# Patient Record
Sex: Male | Born: 1990 | Race: Black or African American | Hispanic: No | Marital: Single | Smoking: Never smoker
Health system: Southern US, Community
[De-identification: ages and names within clinical notes are randomized; demographics above are authoritative.]

---

## 2014-02-18 ENCOUNTER — Emergency Department (HOSPITAL_COMMUNITY)
Admission: EM | Admit: 2014-02-18 | Discharge: 2014-02-18 | Disposition: A | Discharge status: Home / Self Care | Payer: No Typology Code available for payment source | Attending: Emergency Medicine | Admitting: Emergency Medicine

## 2014-02-18 ENCOUNTER — Encounter (HOSPITAL_COMMUNITY): Payer: Self-pay | Admitting: Emergency Medicine

## 2014-02-18 ENCOUNTER — Emergency Department (HOSPITAL_COMMUNITY): Payer: No Typology Code available for payment source

## 2014-02-18 DIAGNOSIS — R519 Headache, unspecified: Secondary | ICD-10-CM

## 2014-02-18 DIAGNOSIS — R002 Palpitations: Secondary | ICD-10-CM | POA: Insufficient documentation

## 2014-02-18 DIAGNOSIS — R079 Chest pain, unspecified: Secondary | ICD-10-CM | POA: Insufficient documentation

## 2014-02-18 DIAGNOSIS — R51 Headache: Secondary | ICD-10-CM | POA: Insufficient documentation

## 2014-02-18 LAB — I-STAT TROPONIN, ED: Troponin i, poc: 0 ng/mL (ref 0.00–0.08)

## 2014-02-18 LAB — CBC
HEMATOCRIT: 40.6 % (ref 39.0–52.0)
Hemoglobin: 13.1 g/dL (ref 13.0–17.0)
MCH: 24 pg — AB (ref 26.0–34.0)
MCHC: 32.3 g/dL (ref 30.0–36.0)
MCV: 74.5 fL — AB (ref 78.0–100.0)
Platelets: 266 10*3/uL (ref 150–400)
RBC: 5.45 MIL/uL (ref 4.22–5.81)
RDW: 13.6 % (ref 11.5–15.5)
WBC: 7 10*3/uL (ref 4.0–10.5)

## 2014-02-18 LAB — BASIC METABOLIC PANEL
BUN: 10 mg/dL (ref 6–23)
CALCIUM: 9.3 mg/dL (ref 8.4–10.5)
CO2: 26 meq/L (ref 19–32)
CREATININE: 0.87 mg/dL (ref 0.50–1.35)
Chloride: 101 mEq/L (ref 96–112)
GFR calc Af Amer: >90 mL/min (ref >90)
GFR calc non Af Amer: >90 mL/min (ref >90)
GLUCOSE: 92 mg/dL (ref 70–99)
Potassium: 4.1 mEq/L (ref 3.7–5.3)
Sodium: 139 mEq/L (ref 137–147)

## 2014-02-18 MED ORDER — DIPHENHYDRAMINE HCL 50 MG/ML IJ SOLN
25.0000 mg | Freq: Once | INTRAMUSCULAR | Status: AC
  Administered 2014-02-18: 25 mg via INTRAVENOUS
  Filled 2014-02-18: qty 1

## 2014-02-18 MED ORDER — SODIUM CHLORIDE 0.9 % IV BOLUS (SEPSIS)
1000.0000 mL | Freq: Once | INTRAVENOUS | Status: AC
  Administered 2014-02-18: 1000 mL via INTRAVENOUS

## 2014-02-18 MED ORDER — METOCLOPRAMIDE HCL 5 MG/ML IJ SOLN
10.0000 mg | Freq: Once | INTRAMUSCULAR | Status: AC
  Administered 2014-02-18: 10 mg via INTRAVENOUS
  Filled 2014-02-18: qty 2

## 2014-02-18 MED ORDER — KETOROLAC TROMETHAMINE 30 MG/ML IJ SOLN
30.0000 mg | Freq: Once | INTRAMUSCULAR | Status: AC
  Administered 2014-02-18: 30 mg via INTRAVENOUS
  Filled 2014-02-18: qty 1

## 2014-02-18 NOTE — ED Provider Notes (Signed)
History/physical exam/procedure(s) were performed by non-physician practitioner and as supervising physician I was immediately available for consultation/collaboration. I have reviewed all notes and am in agreement with care and plan.   Hilario Quarryanielle S Chisom Aust, MD 02/18/14 [phone]

## 2014-02-18 NOTE — ED Provider Notes (Signed)
CSN: [id number]     Arrival date & time 02/18/14  1214 History   First MD Initiated Contact with Patient 02/18/14 1233     Chief Complaint  Patient presents with  . Palpitations  . Headache     (Consider location/radiation/quality/duration/timing/severity/associated sxs/prior Treatment) Patient is a 23 y.o. male presenting with palpitations and headaches. The history is provided by the patient. No language interpreter was used.  Palpitations Palpitations quality:  Fast Onset quality:  Sudden Duration:  5 seconds Timing:  Intermittent Progression:  Unchanged Chronicity:  New Context: exercise   Context: not anxiety, not appetite suppressants, not illicit drugs, not nicotine and not stimulant use   Relieved by:  Nothing Worsened by:  Nothing tried Ineffective treatments:  None tried Associated symptoms: chest pain   Associated symptoms: no dizziness, no nausea, no shortness of breath and no vomiting   Chest pain:    Quality:  Aching   Severity:  Moderate   Onset quality:  Gradual   Duration:  2 days   Timing:  Constant   Progression:  Unchanged   Chronicity:  New Risk factors: no diabetes mellitus, no heart disease, no hx of DVT, no hx of PE, no hx of thyroid disease, no hypercoagulable state, no hyperthyroidism and no stress   Headache Pain location:  Generalized Quality:  Dull Radiates to:  Does not radiate Severity currently:  6/10 Severity at highest:  7/10 Onset quality:  Gradual Duration:  2 days Timing:  Constant Progression:  Unchanged Chronicity:  New Similar to prior headaches: yes   Relieved by:  Nothing Worsened by:  Nothing tried Ineffective treatments:  None tried Associated symptoms: no abdominal pain, no diarrhea, no dizziness, no fatigue, no fever, no nausea, no neck pain and no vomiting   Risk factors: no anger, no family hx of SAH, does not have insomnia and lifestyle not sedentary     History reviewed. No pertinent past medical history. History  reviewed. No pertinent past surgical history. History reviewed. No pertinent family history. History  Substance Use Topics  . Smoking status: Never Smoker   . Smokeless tobacco: Not on file  . Alcohol Use: Yes    Review of Systems  Constitutional: Negative for fever, chills and fatigue.  HENT: Negative for trouble swallowing.   Eyes: Negative for visual disturbance.  Respiratory: Negative for shortness of breath.   Cardiovascular: Positive for chest pain and palpitations.  Gastrointestinal: Negative for nausea, vomiting, abdominal pain and diarrhea.  Genitourinary: Negative for dysuria and difficulty urinating.  Musculoskeletal: Negative for arthralgias and neck pain.  Skin: Negative for color change.  Neurological: Positive for headaches. Negative for dizziness and weakness.  Psychiatric/Behavioral: Negative for dysphoric mood.      Allergies  Review of patient's allergies indicates no known allergies.  Home Medications   Current Outpatient Rx  Name  Route  Sig  Dispense  Refill  . Multiple Vitamin (MULTIVITAMIN WITH MINERALS) TABS tablet   Oral   Take 1 tablet by mouth daily.          BP 122/61  Pulse 85  Temp(Src) 98 F (36.7 C)  Resp 19  Ht 6\' 3"  (1.905 m)  Wt 175 lb (79.379 kg)  BMI 21.87 kg/m2  SpO2 99% Physical Exam  Nursing note and vitals reviewed. Constitutional: He is oriented to person, place, and time. He appears well-developed and well-nourished. No distress.  HENT:  Head: Normocephalic and atraumatic.  Eyes: Conjunctivae and EOM are normal.  Neck: Normal range of  motion.  Cardiovascular: Normal rate and regular rhythm.  Exam reveals no gallop and no friction rub.   No murmur heard. Pulmonary/Chest: Effort normal and breath sounds normal. He has no wheezes. He has no rales. He exhibits no tenderness.  Abdominal: Soft. He exhibits no distension. There is no tenderness. There is no rebound and no guarding.  Musculoskeletal: Normal range of  motion.  Neurological: He is alert and oriented to person, place, and time. Coordination normal.  Speech is goal-oriented. Moves limbs without ataxia.   Skin: Skin is warm and dry.  Psychiatric: He has a normal mood and affect. His behavior is normal.    ED Course  Procedures (including critical care time) Labs Review Labs Reviewed  CBC - Abnormal; Notable for the following:    MCV 74.5 (*)    MCH 24.0 (*)    All other components within normal limits  BASIC METABOLIC PANEL  Rosezena SensorI-STAT TROPOININ, ED   Imaging Review Dg Chest Port 1 View  02/18/2014   CLINICAL DATA:  Shortness of breath.  Chest pain.  EXAM: PORTABLE CHEST - 1 VIEW  COMPARISON:  None.  FINDINGS: The heart size and mediastinal contours are within normal limits. Both lungs are clear. The visualized skeletal structures are unremarkable.  IMPRESSION: No active disease.   Electronically Signed   By: Herbie BaltimoreWalt  Liebkemann M.D.   On: 02/18/2014 13:15     EKG Interpretation   Date/Time:  Monday February 18 2014 12:20:06 EDT Ventricular Rate:  90 PR Interval:  162 QRS Duration: 92 QT Interval:  360 QTC Calculation: 440 R Axis:   94 Text Interpretation:  Normal sinus rhythm Biatrial enlargement Rightward  axis Pulmonary disease pattern Early repolarization Nonspecific ST  abnormality Abnormal ECG Confirmed by RAY MD, Duwayne HeckANIELLE ([id number]) on  02/18/2014 12:25:23 PM      MDM   Final diagnoses:  Chest pain  Palpitations  Headache    1:43 PM EKG unremarkable for acute changes. Chest xray unremarkable for acute changes. Labs pending. Vitals stable and patient afebrile. Patient given fluids, toradol, reglan, and benadryl for headache. Patient is PERC negative.   2:55 PM Labs and chest xray unremarkable. Patient is feeling better and is now eating a sandwich. Patient will be discharged with instructions to follow up with his PCP. Patient advised to return with worsening or concerning symptoms. Vitals stable and patient afebrile.      Emilia BeckKaitlyn Lindsey Demonte, PA-C 02/18/14 1458

## 2014-02-18 NOTE — Discharge Instructions (Signed)
Follow up with your doctor. Return to the ED with worsening or concerning symptoms. Refer to attached documents for more information.  °

## 2014-02-18 NOTE — ED Notes (Signed)
Reports taking vitamin supplements

## 2014-02-18 NOTE — ED Notes (Signed)
Pt reports headache and palpitations, chest pain and body aches.

## 2014-12-26 ENCOUNTER — Encounter (HOSPITAL_COMMUNITY): Payer: Self-pay | Admitting: *Deleted

## 2014-12-26 ENCOUNTER — Inpatient Hospital Stay: Payer: Self-pay | Admitting: Psychiatry

## 2014-12-26 ENCOUNTER — Emergency Department (HOSPITAL_COMMUNITY)
Admission: EM | Admit: 2014-12-26 | Discharge: 2014-12-26 | Disposition: A | Discharge status: Home / Self Care | Payer: Self-pay | Attending: Emergency Medicine | Admitting: Emergency Medicine

## 2014-12-26 DIAGNOSIS — F29 Unspecified psychosis not due to a substance or known physiological condition: Secondary | ICD-10-CM | POA: Diagnosis present

## 2014-12-26 DIAGNOSIS — R45851 Suicidal ideations: Secondary | ICD-10-CM | POA: Insufficient documentation

## 2014-12-26 LAB — CBC
HEMATOCRIT: 39.3 % (ref 39.0–52.0)
Hemoglobin: 12.5 g/dL — ABNORMAL LOW (ref 13.0–17.0)
MCH: 23.4 pg — ABNORMAL LOW (ref 26.0–34.0)
MCHC: 31.8 g/dL (ref 30.0–36.0)
MCV: 73.6 fL — ABNORMAL LOW (ref 78.0–100.0)
Platelets: 254 10*3/uL (ref 150–400)
RBC: 5.34 MIL/uL (ref 4.22–5.81)
RDW: 13.1 % (ref 11.5–15.5)
WBC: 6.8 10*3/uL (ref 4.0–10.5)

## 2014-12-26 LAB — COMPREHENSIVE METABOLIC PANEL
ALBUMIN: 5.2 g/dL (ref 3.5–5.2)
ALK PHOS: 66 U/L (ref 39–117)
ALT: 23 U/L (ref 0–53)
ANION GAP: 8 (ref 5–15)
AST: 36 U/L (ref 0–37)
BUN: 13 mg/dL (ref 6–23)
CO2: 22 mmol/L (ref 19–32)
Calcium: 9.4 mg/dL (ref 8.4–10.5)
Chloride: 104 mEq/L (ref 96–112)
Creatinine, Ser: 0.91 mg/dL (ref 0.50–1.35)
GFR calc non Af Amer: >90 mL/min (ref >90)
Glucose, Bld: 85 mg/dL (ref 70–99)
POTASSIUM: 3.1 mmol/L — AB (ref 3.5–5.1)
SODIUM: 134 mmol/L — AB (ref 135–145)
TOTAL PROTEIN: 7.7 g/dL (ref 6.0–8.3)
Total Bilirubin: 3 mg/dL — ABNORMAL HIGH (ref 0.3–1.2)

## 2014-12-26 LAB — ETHANOL

## 2014-12-26 LAB — ACETAMINOPHEN LEVEL

## 2014-12-26 LAB — RAPID URINE DRUG SCREEN, HOSP PERFORMED
AMPHETAMINES: NOT DETECTED
Barbiturates: NOT DETECTED
Benzodiazepines: NOT DETECTED
Cocaine: NOT DETECTED
Opiates: NOT DETECTED
TETRAHYDROCANNABINOL: NOT DETECTED

## 2014-12-26 LAB — SALICYLATE LEVEL: Salicylate Lvl: <4 mg/dL (ref 2.8–20.0)

## 2014-12-26 MED ORDER — RISPERIDONE 1 MG PO TBDP
1.0000 mg | ORAL_TABLET | Freq: Every day | ORAL | Status: DC
  Filled 2014-12-26: qty 1

## 2014-12-26 MED ORDER — LORAZEPAM 1 MG PO TABS: 1.0000 mg | ORAL_TABLET | Freq: Three times a day (TID) | ORAL | Status: DC | PRN

## 2014-12-26 MED ORDER — TRAZODONE HCL 50 MG PO TABS: 50.0000 mg | ORAL_TABLET | Freq: Every day | ORAL | Status: DC

## 2014-12-26 MED ORDER — NICOTINE 21 MG/24HR TD PT24: 21.0000 mg | MEDICATED_PATCH | Freq: Every day | TRANSDERMAL | Status: DC

## 2014-12-26 MED ORDER — IBUPROFEN 200 MG PO TABS: 600.0000 mg | ORAL_TABLET | Freq: Three times a day (TID) | ORAL | Status: DC | PRN

## 2014-12-26 NOTE — BHH Counselor (Addendum)
Accepted to [facility] by Dr. Ardyth HarpsHernandez. RN number to call report: [phone] RM [phone]   Kateri PlummerKristin Willow Shidler, M.S., LPCA, [facility] Licensed Professional Counselor Associate  Triage Specialist  [facility]  Therapeutic Triage Services Phone: [phone] Fax: [phone]

## 2014-12-26 NOTE — ED Notes (Signed)
[facility], mother aware.  Patient does not understand why he is being admitted.  Mother is asking that he be sent to a hospital in [place] rather than [place].  Explained that we have to accept what is offered, especially since he is under IVC.  [facility]  I gave his mother directions to the hospital and the phone number to the unit.  Patient was cooperative with the discharge process.

## 2014-12-26 NOTE — ED Notes (Signed)
TTS in with patient.  

## 2014-12-26 NOTE — ED Notes (Signed)
[facility].  Joseph Logan is currently in with the patient.  He also visited with his parents this morning, each for about 20 minutes.  He was cooperative during both visits.

## 2014-12-26 NOTE — ED Notes (Signed)
Spoke with parents and they advised that pt posted a picture on Twitter that he had a mini blind cord wrapped around his throat; pt also posted numerous nude photos to Facebook in a woman's wig and had markings on his face and some of the pictures where captioned with "please don't let him kill me"; parents state that pt went through a bad breakup about a year and half ago and that the parents were having problems in their marriage and they believe that he began having issues then; parents states that he has quit college several times and cannot get a job and that this a source of frustration for him; pt currently lives with his grandmother who had a stroke on Friday; per parents pt believes that grandmother is witch.

## 2014-12-26 NOTE — Consult Note (Addendum)
[facility] Face-to-Face Psychiatry Consult   Reason for Consult:  Psychosis Referring Physician:  EDP Patient Identification: Joseph Logan MRN:  [id number] Principal Diagnosis: Psychosis, Unspecified. Diagnosis:   Patient Active Problem List   Diagnosis Date Noted  . Psychosis [F29] 12/26/2014    Priority: High    Class: Acute    Total Time spent with patient: 1 hour  Subjective:   Joseph Logan is a 24 y.o. male patient admitted with Psychotic disorder  HPI:  AA male, 24 years old was evaluated this morning who was brought in by [facility] under IVC taken out by his father.  Initially patient was suicidal but today he denies suicide this morning.  Patient stated this morning that he does not know why he was brought in but accused his parents of "Witch craft"  He also stated that his parents brought him in to be evaluated because they felt that his life was threatened.    Patient reported he feels that spirits are molesting him when he is sleeping and that in rare occasions he has masturbated while sleeping.  He  Reported that he feels like he is a Bisexual male and that God and spirit are working in him.  He reported poor sleep and stated that he does not sleep much at night because that is when "witch craft" occurs.  Patient stated that he sleeps between the hours 8 am to 1 pm.  He admitted to anger issues and yelling at his parents lately.  He reported poor appetite because he is fasting.  He denied SI/HI but stated that he see vision and hears God talking to him.  Patient narrated how he saw Donn Pierini and Madonna's picture coming off the walls.  He reported having "Prophetic power".   When asked about hx of mental illness in the family patient replied that his grandmother is also engaged in "witch craft"  Patient does not have a Therapist, sports but reported that he saw a therapist 2 years ago but could not explain the reason for the therapy session.  Patient denies Alcohol or drug use but admitted to  smoking Marijuana once in a while.  We have accepted patient for admission and will be transporting him to available beds.  Patient  Stated that he does not need help or treatment at this time but requested a preacher or religious person sent to him.Marland Kitchen  HPI Elements:   Location:  Psychosis. Quality:  Disorganized, insomnia, poor appetite. Severity:  severe. Timing:  acute. Duration:  unknown. Context:  IVC by his father and brought in by [employer] for suicidal ideation..  Past Medical History: History reviewed. No pertinent past medical history. History reviewed. No pertinent past surgical history. Family History: No family history on file. Social History:  History  Alcohol Use  . Yes     History  Drug Use  . Yes  . Special: Marijuana    History   Social History  . Marital Status: Single    Spouse Name: N/A    Number of Children: N/A  . Years of Education: N/A   Social History Main Topics  . Smoking status: Never Smoker   . Smokeless tobacco: None  . Alcohol Use: Yes  . Drug Use: Yes    Special: Marijuana  . Sexual Activity: None   Other Topics Concern  . None   Social History Narrative   Additional Social History:    Pain Medications: None Prescriptions: See PTA list Over the Counter: See PTA list History  of alcohol / drug use?: Yes Longest period of sobriety (when/how long): Pt says he has been sober for past year Negative Consequences of Use: Personal relationships, Work / Programmer, multimedia Withdrawal Symptoms:  (Denies) Name of Substance 1: etoh 1 - Age of First Use: 21 1 - Amount (size/oz): Pt did not disclose 1 - Frequency: Daily 1 - Duration: 1 year 1 - Last Use / Amount: Dec 2015 (1 drink) Name of Substance 2: THC 2 - Age of First Use: 21 2 - Amount (size/oz): Pt did not disclose 2 - Frequency: Daily at one time; Now uses occasionally 2 - Duration: 1 year 2 - Last Use / Amount: 1 week ago                  Allergies:  No Known Allergies  Vitals: Blood  pressure 125/94, pulse 89, temperature 98.6 F (37 C), temperature source Oral, resp. rate 18, SpO2 100 %.  Risk to Self: Suicidal Ideation: Yes-Currently Present Suicidal Intent: No Is patient at risk for suicide?: Yes Suicidal Plan?: No Access to Means: Yes Specify Access to Suicidal Means: Rope, anything to hang self What has been your use of drugs/alcohol within the last 12 months?: Pt reports none; says he quit 1 year ago How many times?: 0 Other Self Harm Risks: none Intentional Self Injurious Behavior: None Risk to Others: Homicidal Ideation: No Thoughts of Harm to Others: No Current Homicidal Intent: No Current Homicidal Plan: No Access to Homicidal Means: No Identified Victim: na History of harm to others?: No Assessment of Violence: None Noted Violent Behavior Description: Admits to verbal aggression when angry Does patient have access to weapons?: No Criminal Charges Pending?: No Does patient have a court date: No Prior Inpatient Therapy: Prior Inpatient Therapy: No Prior Therapy Dates: na Prior Therapy Facilty/Provider(s): na Reason for Treatment: na Prior Outpatient Therapy: Prior Outpatient Therapy: Yes Prior Therapy Dates: Pt does not recall Prior Therapy Facilty/Provider(s): Pt does not recall Reason for Treatment: na  Current Facility-Administered Medications  Medication Dose Route Frequency Provider Last Rate Last Dose  . ibuprofen (ADVIL,MOTRIN) tablet 600 mg  600 mg Oral Q8H PRN Arman Filter, NP      . LORazepam (ATIVAN) tablet 1 mg  1 mg Oral Q8H PRN Arman Filter, NP       Current Outpatient Prescriptions  Medication Sig Dispense Refill  . ibuprofen (ADVIL,MOTRIN) 200 MG tablet Take 400 mg by mouth every 6 (six) hours as needed for moderate pain.      Musculoskeletal: Strength & Muscle Tone: within normal limits Gait & Station: normal Patient leans: N/A  Psychiatric Specialty Exam:     Blood pressure 125/94, pulse 89, temperature 98.6 F (37  C), temperature source Oral, resp. rate 18, SpO2 100 %.There is no weight on file to calculate BMI.  General Appearance: Casual  Eye Contact::  Fair  Speech:  Clear and Coherent and Normal Rate  Volume:  Normal  Mood:  Anxious and Hopeless  Affect:  Constricted  Thought Process:  Circumstantial, Disorganized, Loose and Tangential  Orientation:  Full (Time, Place, and Person)  Thought Content:  WDL  Suicidal Thoughts:  No  Homicidal Thoughts:  No  Memory:  Immediate;   Good Recent;   Good Remote;   Fair  Judgement:  Poor  Insight:  Lacking  Psychomotor Activity:  Normal  Concentration:  Good  Recall:  NA  Fund of Knowledge:Poor  Language: Good  Akathisia:  NA  Handed:  Right  AIMS (if indicated):     Assets:  Desire for Improvement  ADL's:  Intact  Cognition: WNL  Sleep:      Medical Decision Making: Review of Psycho-Social Stressors (1), Established Problem, Worsening (2), Review of Medication Regimen & Side Effects (2) and Review of New Medication or Change in Dosage (2)  Problem Points: Review of psycho-social stressors (1)  Data Points: Review of medication regiment & side effects (2) Review of new medications or change in dosage (2)  Treatment Plan Summary: Daily contact with patient to assess and evaluate symptoms and progress in treatment and Medication management  Plan:  Recommend psychiatric Inpatient admission when medically cleared. Disposition: Admit to inpatient Psychiatric unit  Earney NavyONUOHA, JOSEPHINE, C   PMHNP [facility] 12/26/2014 12:42 PM  Patient seen, evaluated and I agree with notes by Nurse Practitioner. Thedore MinsMojeed Evan Osburn, MD

## 2014-12-26 NOTE — ED Notes (Signed)
Pt brought to the ER via [employer] under IVC; when pt asked why he is here pt states "It is not worth talking about. I told them not to bring because I wasn't going to talk"; per IVC papers pt is pictures flying off the wall towards him; per IVC papers pt reports having dreams that his father is raping him and that God told him to act gay and save the entire gay community; per IVC papers pt attempted to give his apartment to a homeless person; when asked about these pt states "we are not going to talk about it"

## 2014-12-26 NOTE — ED Notes (Addendum)
Patient denies SI, HI, AVH. Rates feelings of anxiety 2/10. Denies feelings of depression. Patient reports history of depression related to drug use in his past. Patient reports THC use approximately one month ago, but states that "Since becoming saved I have no more depression and no more habitual drug use". After hesitating and stating "I don't want to lie, but I hesitate because I know how people think, and in being honest, people may feel I need medications or a diagnosis". Patient reports sleeping between 1800 and midnight because he feels that demons come to him in his dreams after midnight. Patient states "I believe my parents send the demons". Patient states that he recently learned that his parents practice witchcraft and this conflicts with his Christian beliefs/practice. Patient reports fasting for past two days. Denies problems with appetite/diet/weight. Patient is focused on his faith and relationship with God. Patient is calm, cooperative; pleasant.  Encouragement offered Gatorade provided.  Q 15 safety checks in place.

## 2014-12-26 NOTE — BH Assessment (Addendum)
ele Assessment Note   Joseph Logan is an African-American, single, 24 y.o. male who [facility]. Affect and mood are bright, eye-contact is good, and hygiene is good. Speech is typically relevant but he talks excessively. Upon entering room, pt is on the floor kneeling, stating that he is Jewish and must pray and fast. He says that he has been fasting for 2 days now. Pt's thought process indicates delusional thought content as evidenced by reports that his grandmother is a witch, that his parents may be raping him at night and are into withcraft, and that his "spirit wives" visit him at night -- which is why he sometimes wakes up sexually aroused, and that God called him to be gay in order to save the gay community. Pt is hyper-religious and believes that God has chosen him to do certain tasks. He states that he does not trust anyone and is initially guarded during the assessment. Pt exhibits a narcissistic attitude and says that doctors need to research what "spirit wives" and "spirit husbands" really are because they often dismiss his statements as indications of mental illness when it is really a spiritual problem. He also says that being in a psychiatric hospital is "beneath him". According to IVC paperwork, pt also has seen pictures flying off of the wall and he attempted to give a homeless man his apartment. The pt's speech and behavior are very disorganized, indicitave of a thought disorder such as schizophrenia; pt says that schizophrenia runs in his family. Pt experiences tactile hallucinations (e.g. Pricks on feet) and reports "seeing black lines".  Pt endorses passive thoughts of suicide without intent or plan. He denies HI and A/VH, instead stating that his hallucinations are "visions" and "prophecies" from God. Above all, pt says that he wants to "get clean" in the sense that he can get rid of the demons bothering him. He does not sleep well both due to the bad dreams, and because  he refuses to sleep between 12am-3am "because those are the witchcraft hours". Reportedly, pt has difficulty controlling his anger and becomes verbally aggressive at times. He denies any physical aggression, as well as any hx of abuse of any kind. 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 No access to weapons reported.   Axis I: 295.90 Schizophrenia Axis II: No diagnosis Axis III: History reviewed. No pertinent past medical history. Axis IV: economic problems, educational problems, housing problems, occupational problems, other psychosocial or environmental problems, problems related to legal system/crime, problems related to social environment, problems with access to health care services and problems with primary support group Axis V: 21-30 behavior considerably influenced by delusions or hallucinations OR serious impairment in judgment, communication OR inability to function in almost all areas  Past Medical History: History reviewed. No pertinent past medical history.  History reviewed. No pertinent past surgical history.  Family History: No family history on file.  Social History:  reports that he has never smoked. He does not have any smokeless tobacco history on file. He reports that he drinks alcohol. He reports that he uses illicit drugs (Marijuana).  Additional Social History:     CIWA: CIWA-Ar BP: 147/82 mmHg Pulse Rate: 92 COWS:    PATIENT STRENGTHS: (choose at least two) Ability for insight Communication skills Physical Health  Allergies: No Known Allergies  Home Medications:  (Not in a hospital admission)  OB/GYN Status:  No LMP for male patient.  Risk to self with the past 6 months Is patient at risk for suicide?: Yes                                     Advance Directives (For Healthcare) Does patient have an advance directive?: No Would patient like information on creating an advanced directive?:  No - patient declined information          Disposition: Per Donell Sievert, PA, pt meets inpt criteria. TTS to seek placement since no appropriate [facility] beds available.     Cyndie Mull, [facility] Triage Specialist  12/26/2014 3:12 AM

## 2014-12-26 NOTE — ED Provider Notes (Addendum)
CSN: [id number]     Arrival date & time 12/26/14  0115 History   First MD Initiated Contact with Patient 12/26/14 0155     Chief Complaint  Patient presents with  . Hallucinations  . IVC      (Consider location/radiation/quality/duration/timing/severity/associated sxs/prior Treatment) HPI Comments: Pteint refused to talk stating"as an American citizen he has the right to keep quiet"   The history is provided by the patient.    History reviewed. No pertinent past medical history. History reviewed. No pertinent past surgical history. No family history on file. History  Substance Use Topics  . Smoking status: Never Smoker   . Smokeless tobacco: Not on file  . Alcohol Use: Yes    Review of Systems  Constitutional: Negative for fever.  Neurological: Negative for headaches.  Psychiatric/Behavioral: Positive for suicidal ideas, behavioral problems and self-injury.  All other systems reviewed and are negative.     Allergies  Review of patient's allergies indicates no known allergies.  Home Medications   Prior to Admission medications   Not on File   BP 155/73 mmHg  Pulse 90  Temp(Src) 98.1 F (36.7 C) (Oral)  Resp 16  SpO2 100% Physical Exam  Constitutional: He is oriented to person, place, and time. He appears well-developed and well-nourished.  HENT:  Head: Normocephalic.  Eyes: Pupils are equal, round, and reactive to light.  Neck: Normal range of motion.  Cardiovascular: Normal rate.   Pulmonary/Chest: Effort normal.  Neurological: He is alert and oriented to person, place, and time.  Skin: Skin is warm.  Psychiatric: He expresses inappropriate judgment.  Refusing to talk  Nursing note and vitals reviewed.   ED Course  Procedures (including critical care time) Labs Review Labs Reviewed  ACETAMINOPHEN LEVEL - Abnormal; Notable for the following:    Acetaminophen (Tylenol), Serum <10.0 (*)    All other components within normal limits  CBC - Abnormal;  Notable for the following:    Hemoglobin 12.5 (*)    MCV 73.6 (*)    MCH 23.4 (*)    All other components within normal limits  COMPREHENSIVE METABOLIC PANEL - Abnormal; Notable for the following:    Sodium 134 (*)    Potassium 3.1 (*)    Total Bilirubin 3.0 (*)    All other components within normal limits  ETHANOL  SALICYLATE LEVEL  URINE RAPID DRUG SCREEN (HOSP PERFORMED)    Imaging Review No results found.   EKG Interpretation None     PAteint brought in with IVC paper will move to psy unit for further evaluation  MDM   Final diagnoses:  None         Arman FilterGail K Wei Poplaski, NP 12/26/14 0224  Arman FilterGail K Hadley Detloff, NP 12/26/14 [id number]  Hanley SeamenJohn L Molpus, MD 12/26/14 2250

## 2014-12-27 LAB — TSH: Thyroid Stimulating Horm: 0.722 u[IU]/mL

## 2014-12-27 LAB — RAPID HIV SCREEN (HIV 1/2 AB+AG)

## 2015-04-06 NOTE — H&P (Signed)
PATIENT NAME:  Joseph Logan, Joseph Logan MR#:  [id number] DATE OF BIRTH:  [birth year]  DATE OF ADMISSION:  12/26/2014  IDENTIFYING INFORMATION: The patient is a 24 year old single [place] American male originally from [place], [place], who currently lives in an apartment in [place], [place] with a roommate.   CHIEF COMPLAINT: "I was doing artistic impressions."   HISTORY OF PRESENT ILLNESS: MrEryck Negron was referred for direct hospitalization from [facility]. He presented there by police on the petition taken by his father on January 21st. The petition states "Respondent is saying pictures are flying off the wall towards him and that God told him to act gay and save the entire gay community. Respondent is stating that he is having bad dreams that his father is raping him and that all his friends and family hate him. Respondent is not sleeping. Respondent tried to give his apartment away to a homeless person. Respondent has painted his face with markings on Facebook and is  naming all of the people that he has had problems with. At [facility], the parents were interviewed. They reported that the patient had posted pictures on Twitter. In this picture, he had a mini blind cord wrapped around his throat. He also posted numerous nude photos on Facebook in a woman's wig and had markings on his face and some of the pictures were captioned with, "Please don't let him kill me." The patient reported that the patient had gone through a bad break-up about a year and a half ago, and that the parents were having problems in their marriage, and they believe that he began having issues around that time, due to those triggers. The patient's parents reported that he had quit college several times and cannot get a job, and that this is a source of frustration for him. The patient currently lives with his grandmother, who had a stroke on Friday. Per parents, the patient believes that his grandmother is a Product/process development scientist.    Today, the patient was interviewed. He reports that he had posted some artistic pictures on Instagram and Facebook. He claims that this is nothing unusual for him, as he has been doing this for a long time. He states that he loves art, and this is what he would like to do in the future. In his latest posting, he was naked. He was wearing a wig with long hair that was covering his genital area. He, however, once again claims that this is not an unusual thing for him to do. He believes that his parents became alarmed after they saw this posting from the Internet and soon after he had made the posting, he saw his parents showing up with the police in his apartment. The patient also reported thinking that his parents and his grandmother were into witchcraft. He knows this for a fact, because he has been having problems that he thinks are being caused by different spirits that his family is sending to bother him. He explains that, for example, one of these spirits is causing him to have dreams at night, where he can see his parents trying to rape him. He has a list of names with these different demons, and he has explanations of what each one of them is capable of doing.  He believes his parents are trying to bother him with these spirits in order to keep him away from God. He explains that lately, he has been reading the Bible more. He believes that he has being called to be  a prophet.   Prior to admission, he denied having any issues with depression, suicidality, homicidality, or auditory or visual hallucinations. The patient denies having any prior history of psychiatric problems and denies any major issues with sleep, appetite, energy, or concentration over the last 2 weeks. The only issue he mentions with his sleep, is that he has been sleeping about 6 hours during the day only, because he thinks the spirits are more active in the evening time. As far as his functioning, the patient states that he dropped out  of [employer] about 6 months ago. Since then, he has been spending his time mostly focusing on reading the Bible. The patient denies any history of physical or sexual abuse. In terms of substance abuse, he denies a history of abusing alcohol, prescription medications, or illicit substances, other than marijuana. The patient said that there was a point in time when he used marijuana heavily; however, over the last 6 months, he has only used once in a while. He believes his last use was about 2 weeks ago. He denies the use of cigarettes.   PAST PSYCHIATRIC HISTORY: Noncontributory. The patient has never been hospitalized before,  He denies any history of self-injurious behaviors or suicidal attempts. 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, he did attend some counseling sessions for depression, and it was recommended to him to take medications. However, he declined. He was advised then to exercise more and to stop using marijuana.   PAST MEDICAL HISTORY: Noncontributory. The patient denies any history of seizure, head trauma, or having any chronic medical conditions or surgeries.    FAMILY HISTORY: The patient reports having an uncle who was diagnosed with schizophrenia.   SOCIAL HISTORY: The patient is single, never married, does not have any children. He lives in an apartment with a roommate in [place], [place]. He is originally from [place]. His parents still live there. The patient, up until recently, was a Ship broker at [employer]; however, he dropped out after he wanted to focus more on reading the Bible and his faith, than to continue his studies. The patient said that his major was in marketing. He claims that he was doing well academically, as he was finding all the answers for he is assignments from [place], so he felt it was a waste of time and decided to leave school. His last job was inside the school and when he dropped out, he lost his job there. The patient  notes that he has also worked as a Dance movement psychotherapist parking and was trying to get a job at [employer].  The patient said that financially, he is supported mainly by his parents. He denies any history of legal problems in the past or present.   ALLERGIES: No known drug allergies.   REVIEW OF SYSTEMS: The patient denies any physical complaints, denies any nausea, vomiting, or diarrhea. The rest of the 10 review of systems is negative.   MENTAL STATUS EXAMINATION:  GENERAL: The patient is a 24 year old, tall and thin African American male, who displays good grooming and hygiene. He is wearing sweatpants and a sweater.  BEHAVIOR: He was cooperative, pleasant, friendly, and talkative. He was smiling and stated that he was very excited to meet with this Probation officer. PSYCHOMOTOR ACTIVITY. Within the normal limits. His eye contact was good. His speech had a regular tone, volume, and rate. His speech, at times, was pushed, but he was easily directable.  THOUGHT PROCESS: Was very difficult  to follow at times, as he was mainly tangential.  THOUGHT CONTENT: Negative for suicidality, homicidality; however, he was positive for having persecutory delusions, and thinking that his parents were sending spirits to bother him. The patient also made some grandiose statements, such as believing that he was a prophet that was chosen to save the gay community.  MOOD: Euthymic. He was overly excited when he met me, as he wanted to speak with a physician; however, I would not say it was close to euphoric; however, this was not the case throughout the rest of the interview.  AFFECT: Reactive and was appropriate.  INSIGHT AND JUDGMENT: Limited.  COGNITIVE ASSESSMENT: He is alert and oriented in person, place, time, and situation. FUND OF KNOWLEDGE: Appears to be average for his level of education.  ATTENTION AND CONCENTRATION: Appear to be intact; however,  they were not formally tested.   PHYSICAL EXAMINATION: MUSCULOSKELETAL: The patient  has a normal gait, normal muscular tone, no evidence of involuntary movements..  GENERAL: The patient is a young African American male, tall and thin, in no acute distress.   LABORATORY RESULTS: Per [facility] in [place], [place], he had WBC that shows a hemoglobin of 12.5, hematocrit 39.3, white blood cells of 6.8, platelets 264. Urine toxicology was negative for any illicit substance, including cannabis. Comprehensive metabolic panel shows a sodium at 134, potassium 3.1, BUN 13, creatinine of 0.91, calcium 9.4. AST is 36, ALT is 23. Acetaminophen level and salicylate level were below detection limit. Alcohol was not detectable.   DIAGNOSIS: Unspecified psychotic disorder. Cannabis use disorder, moderate, rule out bipolar disorder type I, current episode manic.   ASSESSMENT AND PLAN: The patient is a 24 year old, African American male who, over the last 3 months, has become somewhat hyper-religious, thinking he is a prophet that was chosen to save the whole gay community. The patient is also having some paranoid delusions against his parents, thinking that they are sending in spirits at night to bother him. The patient has been making bizarre postings on Instagram and Facebook that made his parents alarmed. The patient had a decrease in functioning, as he has dropped out of school and has been unable to find a job. Per the commitment paper, where it looks that he even donated his apartment to a homeless person. Family was contacted today; however, the communication was quite difficult, as it was hard to understand the patient's father over the phone. He did not feel that the patient was a danger to himself or others. During, So far, his stay in our hospital, the patient has been calm and cooperative and calm.. Also, the patient did not require any restraints or forced medications. At this point in time, I do not feel that the patient's meets criteria for nonemergency forced medications. For right  now, we will observe his behavior and determine whether or not he needs forced medications after a period of close observation.   PLAN: 1. For psychoses: Once again, currently we will not start any treatment, as the patient has declined, and he is not he meeting the criteria for forcing medications at this point in time. For right now, he will be observed closely.  2. Laboratories: I will order an HIV and RPR and a TSH in order to rule out organic causes for current symptoms.  3. Imaging: I will order a head CT in order to rule out any possible organic causes for psychosis.   DISCHARGE DISPOSITION: Once stable, this patient will be  discharged back to his parents and most likely, he will return to his apartment in [place]. The family is interested in having the patient transferred after discharge to a [facility] facility for mental illness in [place].    >90 minutes >50% of the time was spent in coordinationof care: reviewing records, results, speaking with father. ____________________________ Hildred Priest, MD ahg:mw D: 12/27/2014 17:39:07 ET T: 12/27/2014 19:34:27 ET JOB#: [id number]  cc: Hildred Priest, MD, <Dictator> Rhodia Albright MD ELECTRONICALLY SIGNED 01/01/2015 15:02

## 2015-04-15 NOTE — H&P (Signed)
[facility] with psychosis for several months.  No prior history of mental illness.  No PMH.  Pt uses marijuana but denies using recently.  [facility]. petitioned him.  I spoke with them today.  They denied pt has been harmful to self or others.   unspecified psychotic disorder, cannabis use disorder moderate. pt declined from taking any medications.  Not meeting criteria for forcing medications at this time.  Will order head CT, HIV, RPR and TSH.   Electronic Signatures: Jimmy FootmanHernandez-Gonzalez, Amorette Charrette (MD)  (Signed on 22-Jan-16 17:08)  Authored  Last Updated: 22-Jan-16 17:08 by Jimmy FootmanHernandez-Gonzalez, Sema Stangler (MD)

## 2015-06-22 IMAGING — CT CT HEAD WITHOUT CONTRAST
1 series · 16 of 30 positions shown, 20 images · non-contrast
Comparison: None.

CLINICAL DATA: New onset psychosis, new bizarre behavior

EXAM:
CT HEAD WITHOUT CONTRAST
TECHNIQUE: Contiguous axial images were obtained from the base of the skull
through the vertex without intravenous contrast.

[Series 2: head wo · axial · 0.44mm/px · z∈[+346,+490]mm · 16 of 36 slices shown, 20 images]
[im 2/36  brain]
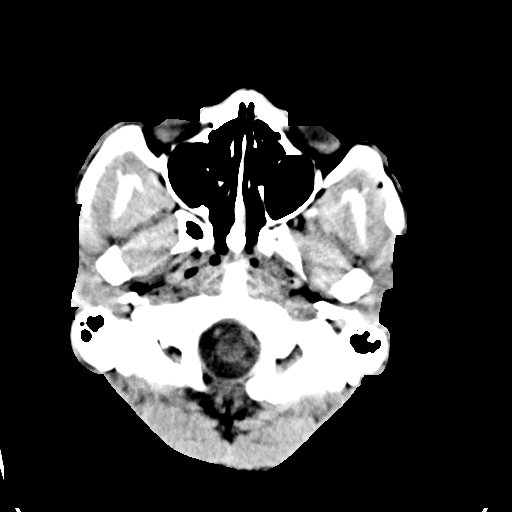
[im 2/36  bone]
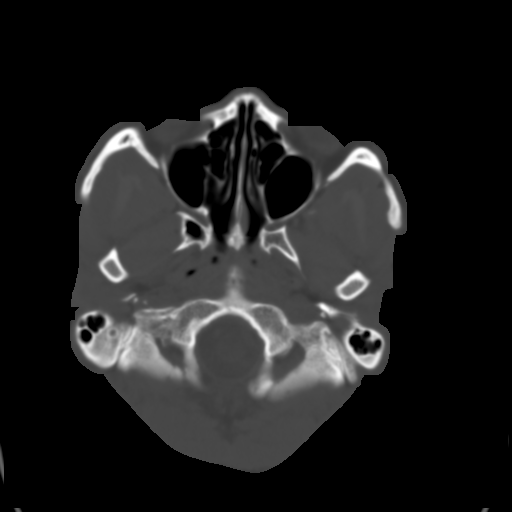
[im 4/36  brain]
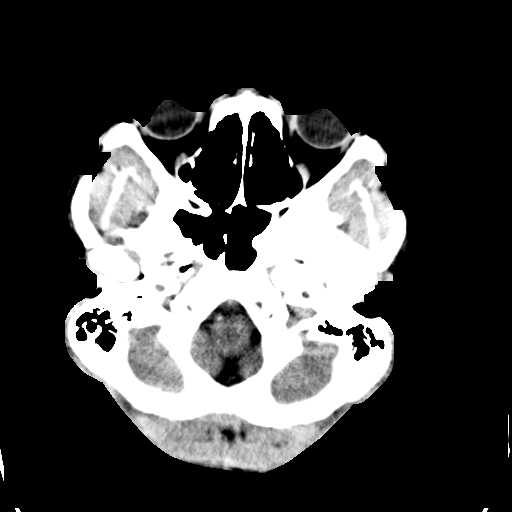
[im 7/36  brain]
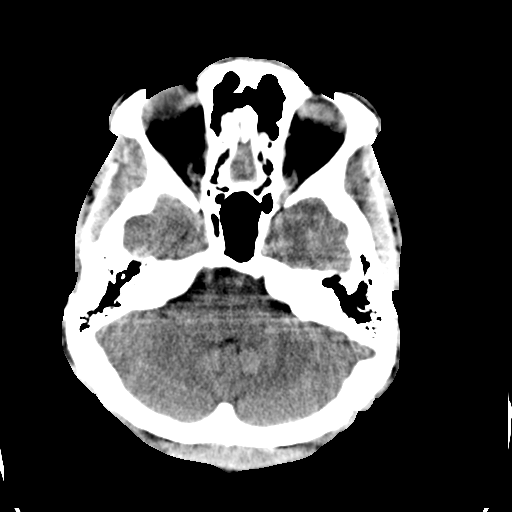
[im 9/36  brain]
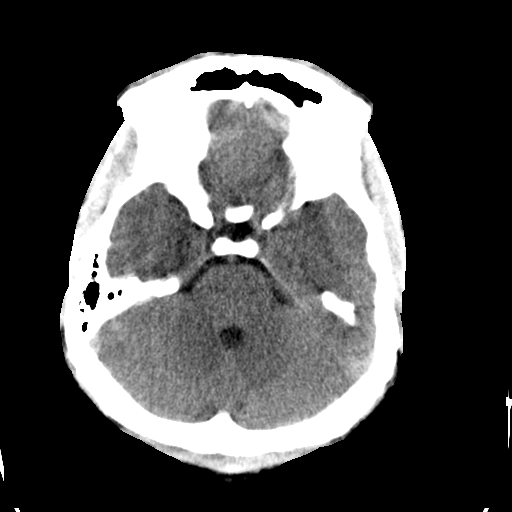
[im 10/36  brain]
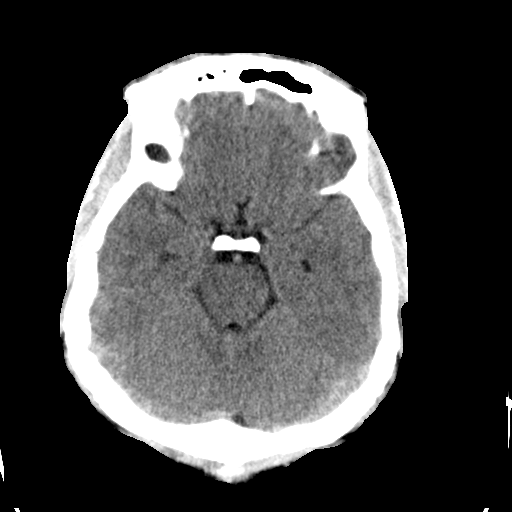
[im 10/36  bone]
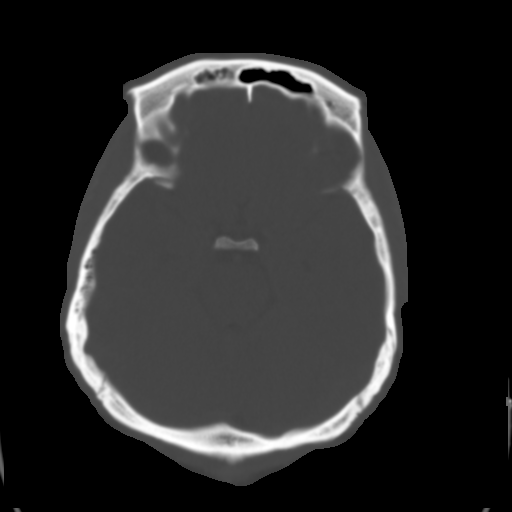
[im 13/36  brain]
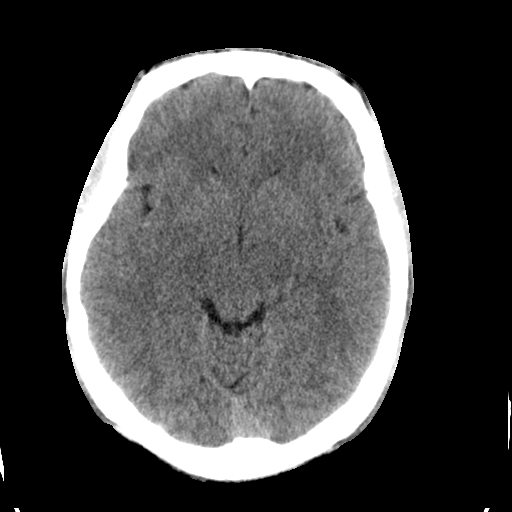
[im 15/36  brain]
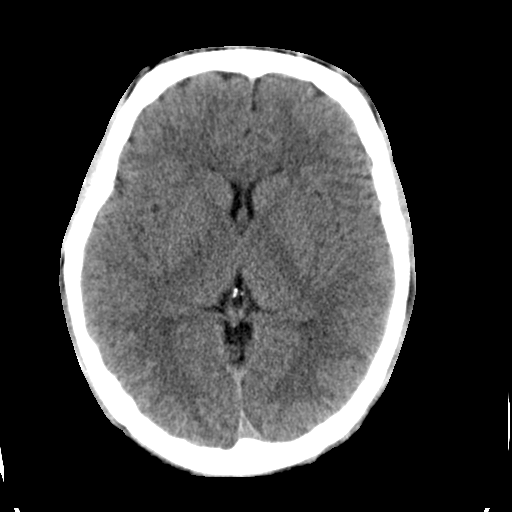
[im 17/36  brain]
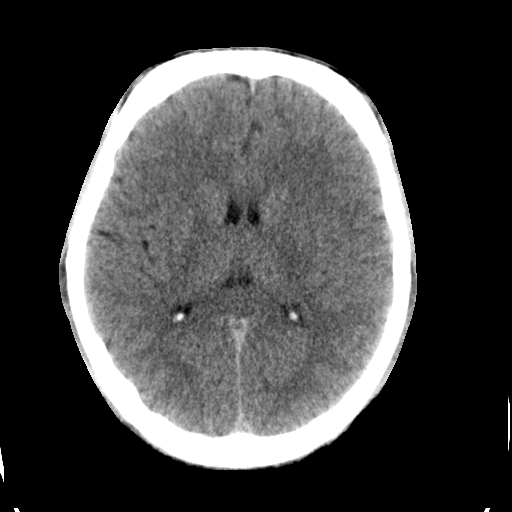
[im 19/36  brain]
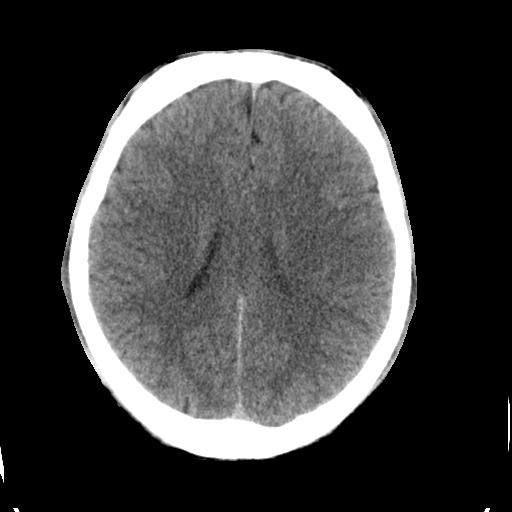
[im 19/36  bone]
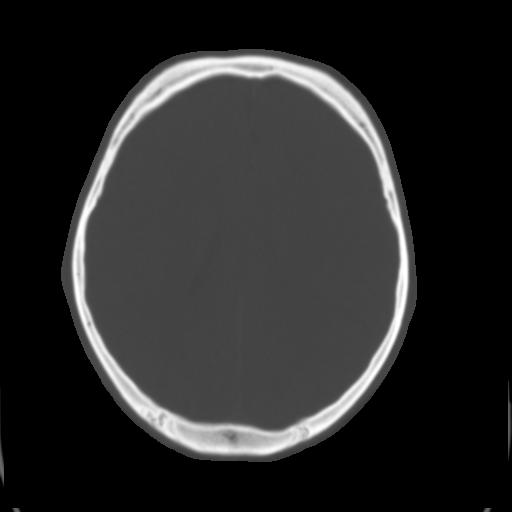
[im 21/36  brain]
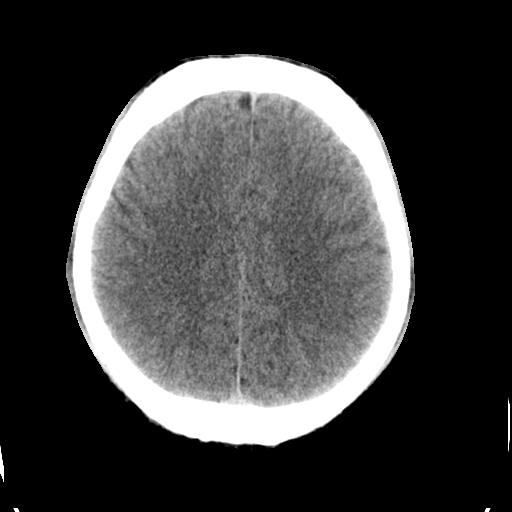
[im 23/36  brain]
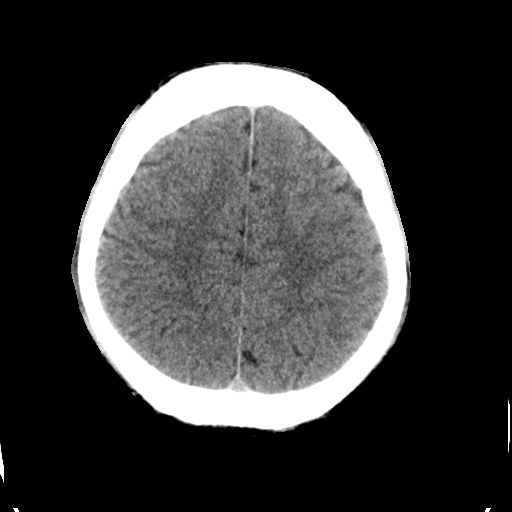
[im 26/36  brain]
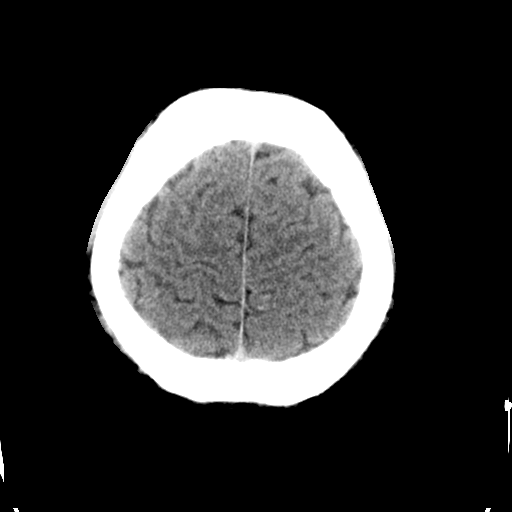
[im 27/36  brain]
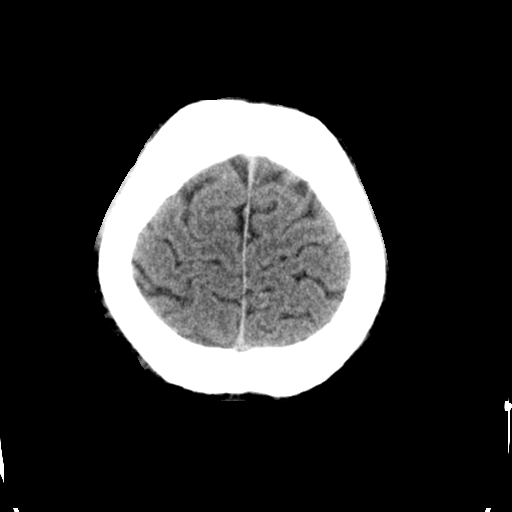
[im 27/36  bone]
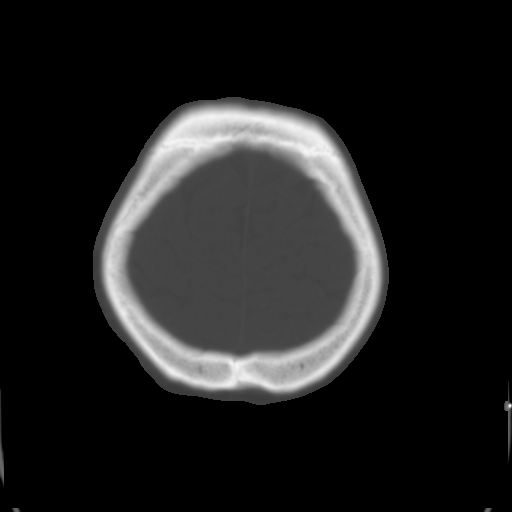
[im 29/36  brain]
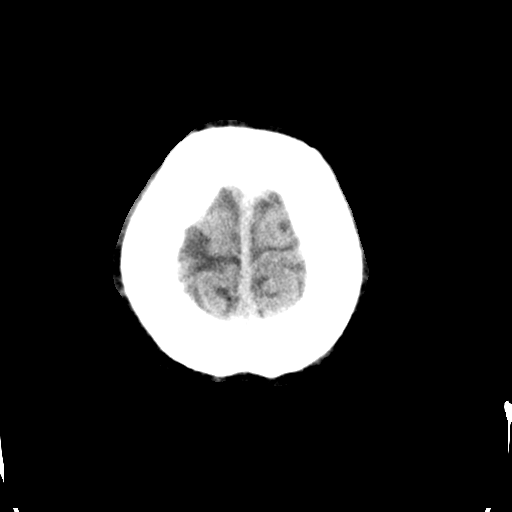
[im 32/36  brain]
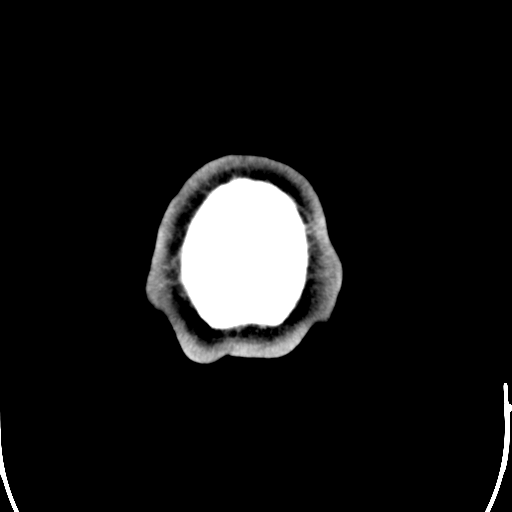
[im 34/36  brain]
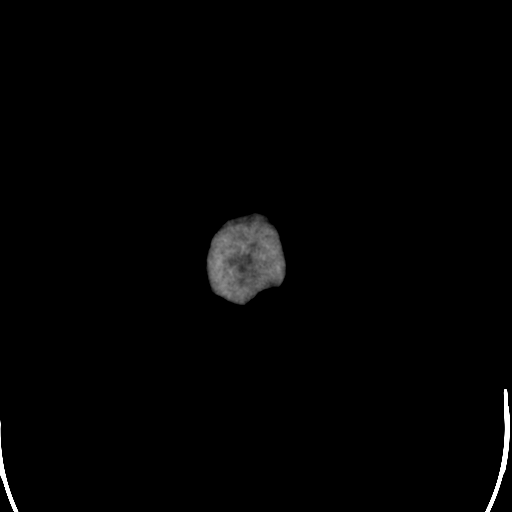

[16 of 30 positions shown; findings below may reference images not displayed]

FINDINGS: Paranasal sinuses and mastoid air cells are unremarkable. No
intracranial hemorrhage, mass effect or midline shift. No acute
cortical infarction. Paranasal sinuses and mastoid air cells are
unremarkable. No intracranial hemorrhage, mass effect or midline
shift. No hydrocephalus. The gray and white-matter differentiation
is preserved. Paranasal sinuses and mastoid air cells are
unremarkable.
IMPRESSION: No acute intracranial abnormality.
